# Patient Record
Sex: Male | Born: 1960 | Race: White | Hispanic: No | Marital: Married | Smoking: Current some day smoker
Health system: Southern US, Community
[De-identification: ages and names within clinical notes are randomized; demographics above are authoritative.]

## PROBLEM LIST (undated history)

## (undated) DIAGNOSIS — E1165 Type 2 diabetes mellitus with hyperglycemia: Secondary | ICD-10-CM

## (undated) DIAGNOSIS — F411 Generalized anxiety disorder: Secondary | ICD-10-CM

## (undated) DIAGNOSIS — F5101 Primary insomnia: Secondary | ICD-10-CM

## (undated) HISTORY — PX: APPENDECTOMY: SHX54

## (undated) HISTORY — DX: Generalized anxiety disorder: F41.1

## (undated) HISTORY — PX: SHOULDER SURGERY: SHX246

## (undated) HISTORY — PX: BACK SURGERY: SHX140

## (undated) HISTORY — DX: Primary insomnia: F51.01

## (undated) HISTORY — DX: Type 2 diabetes mellitus with hyperglycemia: E11.65

---

## 1999-07-28 ENCOUNTER — Inpatient Hospital Stay (HOSPITAL_COMMUNITY): Admission: AD | Admit: 1999-07-28 | Discharge: 1999-07-29 | Payer: Self-pay | Admitting: General Practice

## 2001-05-06 ENCOUNTER — Encounter: Payer: Self-pay | Admitting: Neurosurgery

## 2001-05-10 ENCOUNTER — Encounter: Payer: Self-pay | Admitting: Neurosurgery

## 2001-05-10 ENCOUNTER — Inpatient Hospital Stay (HOSPITAL_COMMUNITY): Admission: RE | Admit: 2001-05-10 | Discharge: 2001-05-13 | Payer: Self-pay | Admitting: Neurosurgery

## 2001-05-26 ENCOUNTER — Encounter: Admission: RE | Admit: 2001-05-26 | Discharge: 2001-05-26 | Payer: Self-pay | Admitting: Neurosurgery

## 2001-05-26 ENCOUNTER — Encounter: Payer: Self-pay | Admitting: Neurosurgery

## 2001-11-08 ENCOUNTER — Encounter (INDEPENDENT_AMBULATORY_CARE_PROVIDER_SITE_OTHER): Payer: Self-pay | Admitting: Specialist

## 2001-11-08 ENCOUNTER — Ambulatory Visit (HOSPITAL_COMMUNITY): Admission: RE | Admit: 2001-11-08 | Discharge: 2001-11-08 | Payer: Self-pay | Admitting: Gastroenterology

## 2002-11-08 ENCOUNTER — Encounter: Payer: Self-pay | Admitting: Neurosurgery

## 2002-11-08 ENCOUNTER — Encounter: Admission: RE | Admit: 2002-11-08 | Discharge: 2002-11-08 | Payer: Self-pay | Admitting: Neurosurgery

## 2002-12-20 ENCOUNTER — Encounter: Payer: Self-pay | Admitting: Neurosurgery

## 2002-12-20 ENCOUNTER — Encounter: Admission: RE | Admit: 2002-12-20 | Discharge: 2002-12-20 | Payer: Self-pay | Admitting: Neurosurgery

## 2003-01-11 ENCOUNTER — Encounter: Payer: Self-pay | Admitting: Neurosurgery

## 2003-01-11 ENCOUNTER — Encounter: Admission: RE | Admit: 2003-01-11 | Discharge: 2003-01-11 | Payer: Self-pay | Admitting: Neurosurgery

## 2004-03-10 ENCOUNTER — Encounter: Admission: RE | Admit: 2004-03-10 | Discharge: 2004-03-10 | Payer: Self-pay | Admitting: Neurosurgery

## 2008-04-23 ENCOUNTER — Encounter: Admission: RE | Admit: 2008-04-23 | Discharge: 2008-04-23 | Payer: Self-pay | Admitting: Orthopedic Surgery

## 2008-05-15 ENCOUNTER — Encounter (INDEPENDENT_AMBULATORY_CARE_PROVIDER_SITE_OTHER): Payer: Self-pay | Admitting: Orthopedic Surgery

## 2008-05-15 ENCOUNTER — Ambulatory Visit (HOSPITAL_COMMUNITY): Admission: RE | Admit: 2008-05-15 | Discharge: 2008-05-16 | Payer: Self-pay | Admitting: Orthopedic Surgery

## 2008-09-20 IMAGING — CR DG CHEST 2V
2 series · 2 of 2 positions shown · non-contrast
Comparison: None

CLINICAL DATA: Preop, rotator cuff repair

CHEST - 2 VIEW

[w chest pa]
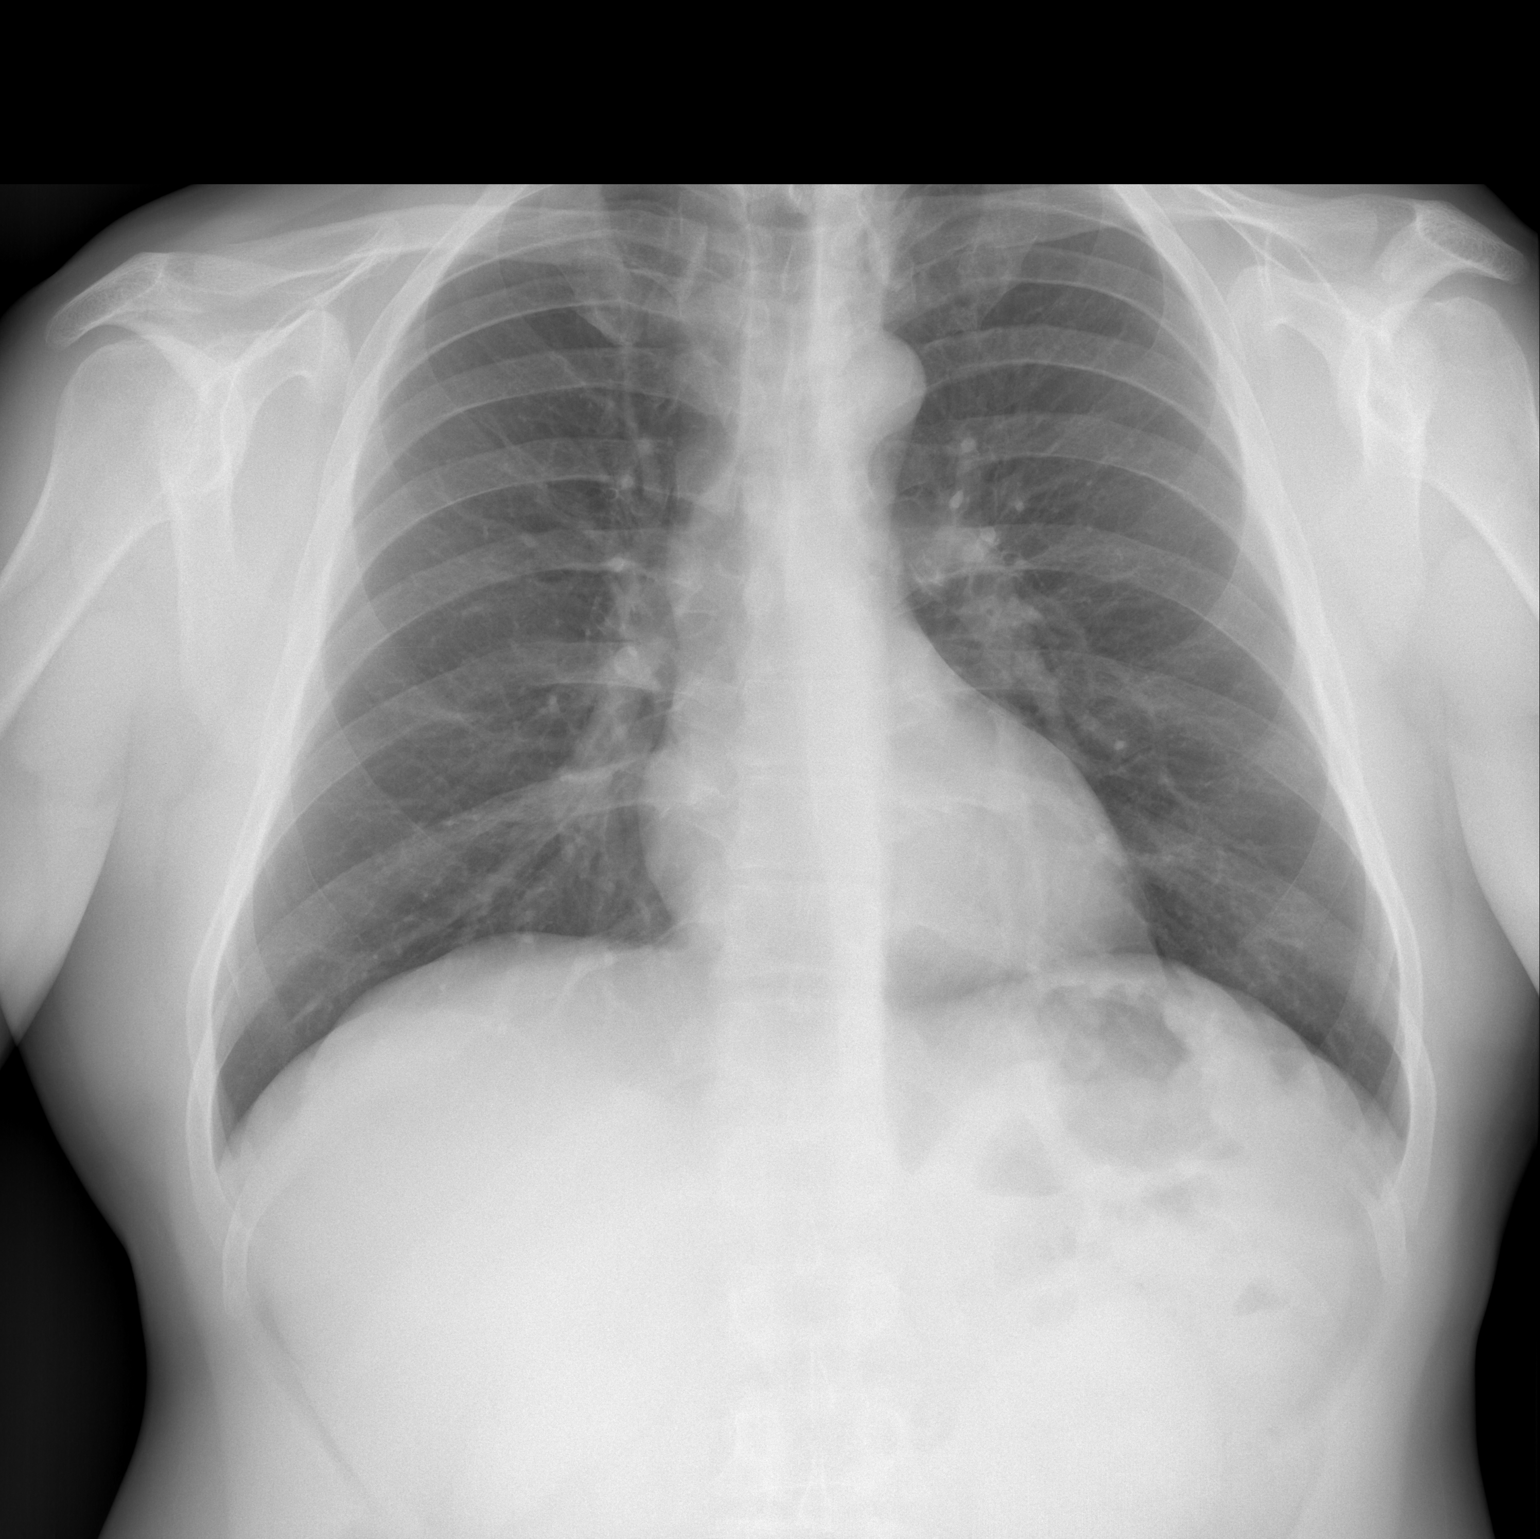

[w chest lat]
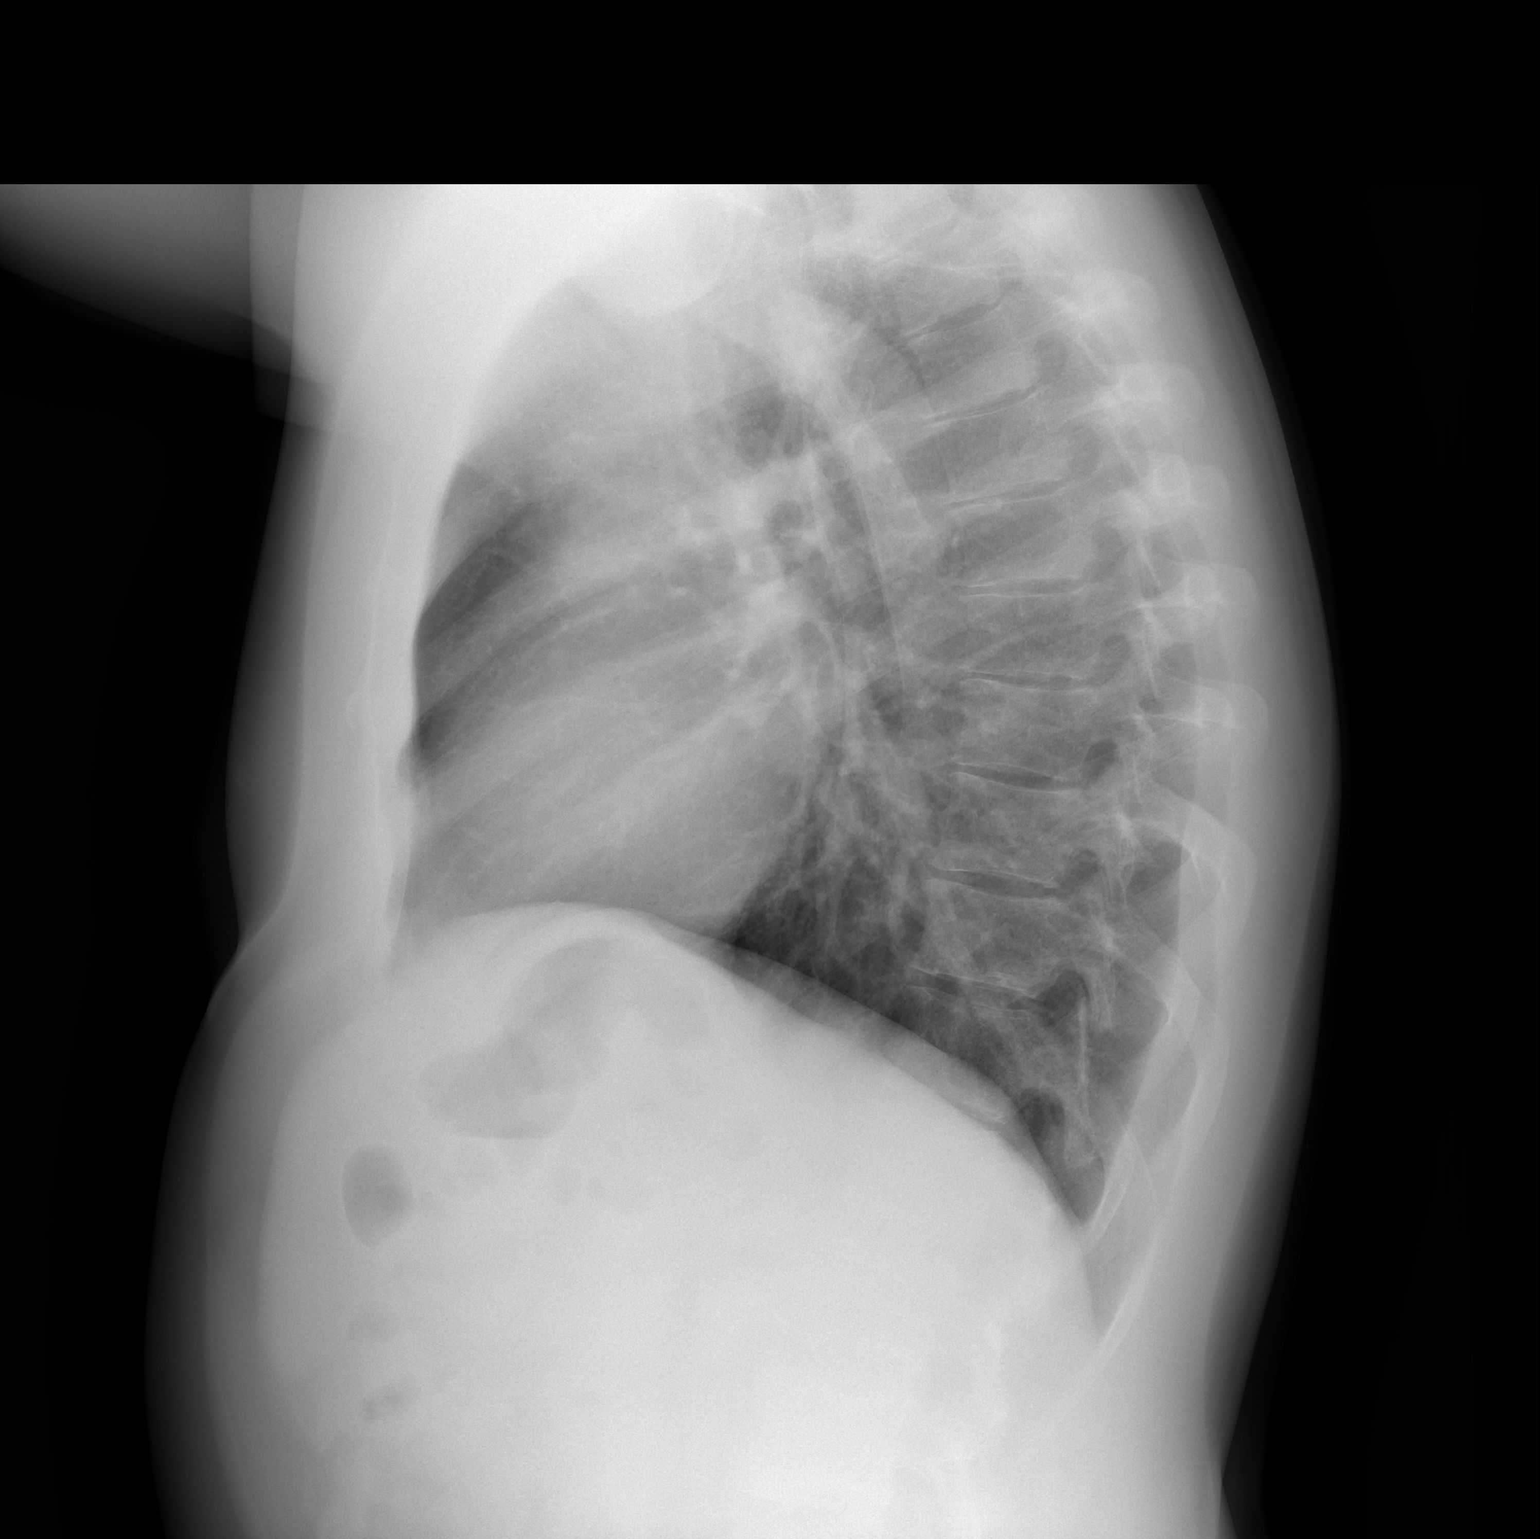

[2 of 2 positions shown; findings below may reference images not displayed]

FINDINGS: The heart is normal in size.  Mild tortuosity of the
aortic arch is present.  Bronchitic changes have a chronic
appearance.  Lungs are clear.  No pneumothoraces or effusions are
seen.  The thoracic spine is intact.
IMPRESSION: No active cardiopulmonary disease.

## 2010-10-12 ENCOUNTER — Encounter: Payer: Self-pay | Admitting: Neurosurgery

## 2011-02-03 NOTE — Op Note (Signed)
NAME:  Harry Bolton, Harry Bolton          ACCOUNT NO.:  [id number]   MEDICAL RECORD NO.:  [id number]          PATIENT TYPE:  AMB   LOCATION:  DAY                          FACILITY:  [facility]   PHYSICIAN:  Ronald A. Gioffre, M.D.DATE OF BIRTH:  [birth year]   DATE OF PROCEDURE:  05/15/2008  DATE OF DISCHARGE:                               OPERATIVE REPORT   SURGEON:  Georges Lynch. Darrelyn Hillock, M.D.   ASSISTANT:  Jamelle Rushing, P.A.   PREOPERATIVE DIAGNOSES:  1. Large ganglion cyst of the acromioclavicular joint, right shoulder.  2. Severe impingement syndrome, right shoulder.  3. Complete retracted tear of the rotator cuff tendon, right shoulder.   POSTOPERATIVE DIAGNOSES:  1. Large ganglion cyst the of the acromioclavicular joint, right      shoulder.  2. Severe impingement syndrome, right shoulder.  3. Complete retracted tear of the rotator cuff tendon, right shoulder.  4. Os acromiale.   OPERATION:  1. Excision of the os acromiale.  2. Partial acromioplasty.  3. Excision of a large ganglion cyst that appeared to be coming from      the os acromiale site.  4. Repair of a large retracted tear of the right rotator cuff tendon      utilizing a Restore tendon graft  and one PEEK anchor.   PROCEDURE:  First of all, the patient had 1 g of IV vancomycin preop.  At this time, a routine prep and drape of the right shoulder was carried  out.  An incision was made over the anterior aspect of the right  shoulder, I went down and identified the ganglion cyst and removed it.  It literally was adhered to the os acromiale junction with the acromion.  At that particular time I went down and split the deltoid tendon and  proximal portion of the deltoid muscle.  I split the deltoid tendon and  gently peeled that off of the os acromiale.  The os acromiale was  obviously moving about with pressure.  It was literally imbedded down  into the large hole that had created into the rotator cuff with  retraction.  At  that time we protected the rotator cuff and then gently  removed the os acromiale fragment that measured about 1.5 x 1.5 cm.  Following that we went down and protected the cuff again and then  utilized the bur and did an acromioplasty.  After we had reestablished  the subacromial space, we then utilized a bur to bur down the lateral  aspect of the distal humerus.  We removed some of the articular  cartilage until we had good bleeding bone.  I then inserted the PEEK  anchor into the proximal humerus with 4 sutures that literally came out  of the anchor.  We irrigated the area out.  We first did a primary side-  to-side repair of the proximal part of the rotator cuff.  We then  brought the suture anchors through the cuff and sutured it down in  place.  The other 2 anchors were utilized when we applied all our tissue  mend graft over the site with a 2 x  2-cm graft over the repair site.  We  anchored that down in place with #1 Tycron sutures.  Thoroughly  irrigated out the area.  He had a nice repair.  We then reapproximated  the deltoid tendon and muscle in the usual fashion.  Subcu was closed  with 0 Vicryl, skin with metal staples.  Note prior to being taken back  to surgery while he was in the holding area he had an interscalene nerve  block on the right.  Sterile Neosporin dressings were applied and he was  placed in a shoulder immobilizer.   Note, we did have a potassium 2.5 yesterday so we gave him potassium  supplement and at the time of surgery in the preop area his potassium  was rechecked, it was 2.9, upper limits of normal 3.5.  We then gave him  some IV potassium as well.           ______________________________  Georges Lynch. Darrelyn Hillock, M.D.     RAG/MEDQ  D:  05/15/2008  T:  05/16/2008  Job:  [id number]

## 2011-02-06 NOTE — Procedures (Signed)
[facility]  Patient:    NIV, DARLEY Visit Number: [id number] MRN: [id number]          Service Type: END Location: ENDO Attending Physician:  Nelda Marseille Dictated by:   Petra Kuba, M.D. Proc. Date: 11/08/01 Admit Date:  11/08/2001   CC:         Harry Bolton, M.D.  Gordan Payment, M.D., , [place]   Procedure Report  PROCEDURE:  Colonoscopy with biopsy.  INDICATION:  Diarrhea with some GI bleeding.  Consent was signed after risks, benefits, methods, and options thoroughly discussed in the office.  MEDICATIONS:  Demerol 150, Versed 13.  DESCRIPTION OF PROCEDURE:  Rectal inspection was pertinent for external hemorrhoids, small.  Digital exam was negative.  The video colonoscope was inserted and easily advanced around the colon to the cecum.  This did not require any abdominal pressure or any position changes.  The cecum was identified by the appendiceal orifice and the ileocecal valve.  No obvious abnormality was seen on insertion.  The scope was inserted a short ways into the terminal ileum which was normal; photodocumentation was obtained. Scattered biopsies of the TI were obtained and put in the first container. The scope was slowly withdrawn.  The prep was adequate.  There was some liquid stool that required washing and suctioning.  On slow withdrawal back to the rectum, the colon was normal.  Scattered random biopsies of the colon were obtained of all areas and put in a second container.  In the distal sigmoid, a questionable tiny polyp was seen and was cold biopsied x 2 and put in a third container.  Once back in the rectum, the scope was retroflexed, pertinent for some small internal hemorrhoids.  The scope was straightened and readvanced a short ways around the left side of the colon.   Air was suctioned and the scope removed.  The patient tolerated the procedure well.  There was no obvious immediate  complication.  ENDOSCOPIC DIAGNOSES: 1. Internal/external hemorrhoids. 2. Questionable tiny distal sigmoid polyp, status post cold biopsied. 3. Otherwise within normal limits to the terminal ileum, status post random    biopsies throughout.  PLAN:  Await pathology.  Trial of Robinul.  Probably upper GI small-bowel follow-through next but will wait on pathology and see how Robinul works. Dictated by:   Petra Kuba, M.D. Attending Physician:  Nelda Marseille DD:  11/08/01 TD:  11/08/01 Job: [id number]

## 2011-02-06 NOTE — Op Note (Signed)
[place]. [facility]  Patient:    Harry Bolton, Harry Bolton Visit Number: [id number] MRN: [id number]          Service Type: SUR Location: 3000 3039 [id number] Attending Physician:  Emeterio Reeve Proc. Date: 05/10/01 Adm. Date:  05/10/2001                             Operative Report  PREOPERATIVE DIAGNOSIS:   Severe spondylosis L5-S1.  POSTOPERATIVE DIAGNOSIS:  Severe spondylosis L5-S1.  OPERATION:  L5-S1 laminectomy, diskectomy and posterior lumbar interbody fusion with Ray-Tec infusion cage.  SURGEON:  Payton Doughty, M.D.  ASSISTANT:  Tanya Nones. Jeral Fruit, M.D.  ANESTHESIA:  General anesthesia.  PREPARATION:  Prepped with sterile Betadine and scrubbed with an alcohol wipe.  COMPLICATIONS:  None.  INDICATIONS:  [age]-year-old white gentleman with severe spondylosis of the lumbar spine and positive discography at L5-S1.   DESCRIPTION OF PROCEDURE: He was taken to the operating room, smoothly anesthetized and intubated and placed prone on the operating table.  Following shave, prep and drape in the usual sterile fashion, the skin was infiltrated with 1% lidocaine 1:400,000 epinephrine, and the skin was incised from the mid S1 to the mid L4 and the lamina of L5 and the L5-S1 facet joints were exposed bilaterally.  Intraoperative x-ray confirmed correctness level.  The pars interarticularis lamina, inferior facet of L5 and the superior facet of S1 were removed bilaterally and the bone set aside for grafting.  The ligament of flavum was removed, and the L5 and S1 nerve roots were bilaterally dissected free as they round their respective pedicles.  On the right side, there was modest facet arthropathy with some lateral recess narrowing on the left side. There was fairly profound facet arthropathy with almost no articular cartilage remaining in the facet joint and significant lateral recess narrowing. Following complete decompression of the nerve roots  bilaterally, the disk was removed without difficulty.  The 14 x 20 mm Ray-Tec infusion cages were then placed.  Intraoperative x-ray showed good placement of the cages.  They were packed with bone graft and harvested from the facet joints and capped.  The wound was irrigated and hemostasis assured.  The fascia was reapproximated with 0 Vicryl in an interrupted fashion.  The subcutaneous tissue was reapproximated with 0 Vicryl in an interrupted fashion.  The subcuticular tissue was reapproximated with 3-0 Vicryl in an interrupted fashion.  The skin was closed with 3-0 nylon in a running locked fashion.  Betadine and Telfa dressing was applied and made occlusive with Op-Site.  The patient returned to the recovery room in good condition. Attending Physician:  Emeterio Reeve DD:  05/10/01 TD:  05/10/01 Job: [id number]

## 2011-02-06 NOTE — Procedures (Signed)
[facility]  Patient:    Harry Bolton, Harry Bolton Visit Number: [id number] MRN: [id number]          Service Type: END Location: ENDO Attending Physician:  Nelda Marseille Dictated by:   Petra Kuba, M.D. Proc. Date: 11/08/01 Admit Date:  11/08/2001                             Procedure Report  NO DICTATION. Dictated by:   Petra Kuba, M.D. Attending Physician:  Nelda Marseille DD:  11/08/01 TD:  11/08/01 Job: [id number]
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
## 2013-06-05 DIAGNOSIS — G8929 Other chronic pain: Secondary | ICD-10-CM | POA: Insufficient documentation

## 2013-06-05 HISTORY — DX: Other chronic pain: G89.29

## 2015-04-01 DIAGNOSIS — I10 Essential (primary) hypertension: Secondary | ICD-10-CM | POA: Insufficient documentation

## 2015-04-01 DIAGNOSIS — I340 Nonrheumatic mitral (valve) insufficiency: Secondary | ICD-10-CM | POA: Insufficient documentation

## 2015-04-01 DIAGNOSIS — I341 Nonrheumatic mitral (valve) prolapse: Secondary | ICD-10-CM

## 2015-04-01 HISTORY — DX: Nonrheumatic mitral (valve) prolapse: I34.1

## 2015-04-01 HISTORY — DX: Essential (primary) hypertension: I10

## 2015-04-01 HISTORY — DX: Nonrheumatic mitral (valve) insufficiency: I34.0

## 2015-09-09 DIAGNOSIS — F17210 Nicotine dependence, cigarettes, uncomplicated: Secondary | ICD-10-CM

## 2015-09-09 HISTORY — DX: Nicotine dependence, cigarettes, uncomplicated: F17.210

## 2015-09-12 ENCOUNTER — Other Ambulatory Visit: Payer: Self-pay

## 2016-03-25 DIAGNOSIS — M5136 Other intervertebral disc degeneration, lumbar region: Secondary | ICD-10-CM | POA: Insufficient documentation

## 2016-03-25 DIAGNOSIS — Z8619 Personal history of other infectious and parasitic diseases: Secondary | ICD-10-CM

## 2016-03-25 DIAGNOSIS — K219 Gastro-esophageal reflux disease without esophagitis: Secondary | ICD-10-CM

## 2016-03-25 DIAGNOSIS — K589 Irritable bowel syndrome without diarrhea: Secondary | ICD-10-CM

## 2016-03-25 DIAGNOSIS — F909 Attention-deficit hyperactivity disorder, unspecified type: Secondary | ICD-10-CM

## 2016-03-25 DIAGNOSIS — M51369 Other intervertebral disc degeneration, lumbar region without mention of lumbar back pain or lower extremity pain: Secondary | ICD-10-CM

## 2016-03-25 DIAGNOSIS — Z79899 Other long term (current) drug therapy: Secondary | ICD-10-CM | POA: Insufficient documentation

## 2016-03-25 DIAGNOSIS — N529 Male erectile dysfunction, unspecified: Secondary | ICD-10-CM | POA: Insufficient documentation

## 2016-03-25 DIAGNOSIS — E782 Mixed hyperlipidemia: Secondary | ICD-10-CM

## 2016-03-25 DIAGNOSIS — F419 Anxiety disorder, unspecified: Secondary | ICD-10-CM

## 2016-03-25 HISTORY — DX: Mixed hyperlipidemia: E78.2

## 2016-03-25 HISTORY — DX: Gastro-esophageal reflux disease without esophagitis: K21.9

## 2016-03-25 HISTORY — DX: Personal history of other infectious and parasitic diseases: Z86.19

## 2016-03-25 HISTORY — DX: Attention-deficit hyperactivity disorder, unspecified type: F90.9

## 2016-03-25 HISTORY — DX: Other intervertebral disc degeneration, lumbar region without mention of lumbar back pain or lower extremity pain: M51.369

## 2016-03-25 HISTORY — DX: Other long term (current) drug therapy: Z79.899

## 2016-03-25 HISTORY — DX: Anxiety disorder, unspecified: F41.9

## 2016-03-25 HISTORY — DX: Irritable bowel syndrome, unspecified: K58.9

## 2016-03-25 HISTORY — DX: Male erectile dysfunction, unspecified: N52.9

## 2016-03-25 HISTORY — DX: Other intervertebral disc degeneration, lumbar region: M51.36

## 2017-01-12 DIAGNOSIS — N433 Hydrocele, unspecified: Secondary | ICD-10-CM

## 2017-01-12 HISTORY — DX: Hydrocele, unspecified: N43.3

## 2017-08-02 DIAGNOSIS — R7303 Prediabetes: Secondary | ICD-10-CM | POA: Insufficient documentation

## 2017-08-02 HISTORY — DX: Prediabetes: R73.03

## 2017-12-31 ENCOUNTER — Encounter: Payer: Self-pay | Admitting: Gastroenterology

## 2018-02-21 DIAGNOSIS — R011 Cardiac murmur, unspecified: Secondary | ICD-10-CM

## 2018-02-21 HISTORY — DX: Cardiac murmur, unspecified: R01.1

## 2020-04-04 DIAGNOSIS — R5381 Other malaise: Secondary | ICD-10-CM

## 2020-04-04 HISTORY — DX: Other malaise: R53.81

## 2021-09-30 DIAGNOSIS — R600 Localized edema: Secondary | ICD-10-CM | POA: Insufficient documentation

## 2021-09-30 HISTORY — DX: Localized edema: R60.0

## 2022-01-13 ENCOUNTER — Encounter: Payer: Self-pay | Admitting: *Deleted

## 2022-01-13 ENCOUNTER — Encounter: Payer: Self-pay | Admitting: Cardiology

## 2022-02-11 ENCOUNTER — Ambulatory Visit: Payer: Medicare Other | Admitting: Cardiology

## 2022-02-25 ENCOUNTER — Encounter: Payer: Self-pay | Admitting: Cardiology

## 2022-02-25 ENCOUNTER — Ambulatory Visit: Payer: Medicare Other | Admitting: Cardiology

## 2022-02-25 VITALS — BP 156/106 | HR 78 | Ht 68.0 in | Wt 224.2 lb

## 2022-02-25 DIAGNOSIS — R011 Cardiac murmur, unspecified: Secondary | ICD-10-CM

## 2022-02-25 DIAGNOSIS — E782 Mixed hyperlipidemia: Secondary | ICD-10-CM | POA: Diagnosis not present

## 2022-02-25 DIAGNOSIS — I10 Essential (primary) hypertension: Secondary | ICD-10-CM

## 2022-02-25 DIAGNOSIS — F17210 Nicotine dependence, cigarettes, uncomplicated: Secondary | ICD-10-CM | POA: Diagnosis not present

## 2022-02-25 DIAGNOSIS — N289 Disorder of kidney and ureter, unspecified: Secondary | ICD-10-CM

## 2022-02-25 DIAGNOSIS — E669 Obesity, unspecified: Secondary | ICD-10-CM

## 2022-02-25 DIAGNOSIS — E66811 Obesity, class 1: Secondary | ICD-10-CM

## 2022-02-25 HISTORY — DX: Obesity, class 1: E66.811

## 2022-02-25 HISTORY — DX: Obesity, unspecified: E66.9

## 2022-02-25 HISTORY — DX: Disorder of kidney and ureter, unspecified: N28.9

## 2022-02-25 HISTORY — DX: Cardiac murmur, unspecified: R01.1

## 2022-02-25 NOTE — Patient Instructions (Signed)
Please keep a BP log for 2 weeks and send by MyChart or mail.  Blood Pressure Record Sheet To take your blood pressure, you will need a blood pressure machine. You can buy a blood pressure machine (blood pressure monitor) at your clinic, drug store, or online. When choosing one, consider: An automatic monitor that has an arm cuff. A cuff that wraps snugly around your upper arm. You should be able to fit only one finger between your arm and the cuff. A device that stores blood pressure reading results. Do not choose a monitor that measures your blood pressure from your wrist or finger. Follow your health care provider's instructions for how to take your blood pressure. To use this form: Get one reading in the morning (a.m.) 1-2 hours after you take any medicines. Get one reading in the evening (p.m.) before supper. Write down the results in the spaces on this form. Repeat this once a week, or as told by your health care provider.  Make a follow-up appointment with your health care provider to discuss the results. Blood pressure log Date: _______________________ a.m. _____________________(1st reading) HR___________            p.m. _____________________(2nd reading) HR__________  Date: _______________________ a.m. _____________________(1st reading) HR___________            p.m. _____________________(2nd reading) HR__________ Date: _______________________ a.m. _____________________(1st reading) HR___________            p.m. _____________________(2nd reading) HR__________ Date: _______________________ a.m. _____________________(1st reading) HR___________            p.m. _____________________(2nd reading) HR__________  Date: _______________________ a.m. _____________________(1st reading) HR___________            p.m. _____________________(2nd reading) HR__________  Date: _______________________ a.m. _____________________(1st reading) HR___________            p.m.  _____________________(2nd reading) HR__________  Date: _______________________ a.m. _____________________(1st reading) HR___________            p.m. _____________________(2nd reading) HR__________   This information is not intended to replace advice given to you by your health care provider. Make sure you discuss any questions you have with your health care provider. Document Revised: 12/27/2019 Document Reviewed: 12/27/2019 Elsevier Patient Education  2021 Elsevier Inc.   Medication Instructions:  Your physician recommends that you continue on your current medications as directed. Please refer to the Current Medication list given to you today.  *If you need a refill on your cardiac medications before your next appointment, please call your pharmacy*   Lab Work: None ordered If you have labs (blood work) drawn today and your tests are completely normal, you will receive your results only by: MyChart Message (if you have MyChart) OR A paper copy in the mail If you have any lab test that is abnormal or we need to change your treatment, we will call you to review the results.   Testing/Procedures: Your physician has requested that you have an echocardiogram. Echocardiography is a painless test that uses sound waves to create images of your heart. It provides your doctor with information about the size and shape of your heart and how well your heart's chambers and valves are working. This procedure takes approximately one hour. There are no restrictions for this procedure.    Follow-Up: At [facility], you and your health needs are our priority.  As part of our continuing mission to provide you with exceptional heart care, we have created designated Provider Care Teams.  These Care Teams include your primary  Cardiologist (physician) and Advanced Practice Providers (APPs -  Physician Assistants and Nurse Practitioners) who all work together to provide you with the care you need, when  you need it.  We recommend signing up for the patient portal called "MyChart".  Sign up information is provided on this After Visit Summary.  MyChart is used to connect with patients for Virtual Visits (Telemedicine).  Patients are able to view lab/test results, encounter notes, upcoming appointments, etc.  Non-urgent messages can be sent to your provider as well.   To learn more about what you can do with MyChart, go to [email or web address].    Your next appointment:   3 month(s)  The format for your next appointment:   In Person  Provider:   Belva Crome, MD   Other Instructions Echocardiogram An echocardiogram is a test that uses sound waves (ultrasound) to produce images of the heart. Images from an echocardiogram can provide important information about: Heart size and shape. The size and thickness and movement of your heart's walls. Heart muscle function and strength. Heart valve function or if you have stenosis. Stenosis is when the heart valves are too narrow. If blood is flowing backward through the heart valves (regurgitation). A tumor or infectious growth around the heart valves. Areas of heart muscle that are not working well because of poor blood flow or injury from a heart attack. Aneurysm detection. An aneurysm is a weak or damaged part of an artery wall. The wall bulges out from the normal force of blood pumping through the body. Tell a health care provider about: Any allergies you have. All medicines you are taking, including vitamins, herbs, eye drops, creams, and over-the-counter medicines. Any blood disorders you have. Any surgeries you have had. Any medical conditions you have. Whether you are pregnant or may be pregnant. What are the risks? Generally, this is a safe test. However, problems may occur, including an allergic reaction to dye (contrast) that may be used during the test. What happens before the test? No specific preparation is needed. You may  eat and drink normally. What happens during the test? You will take off your clothes from the waist up and put on a hospital gown. Electrodes or electrocardiogram (ECG)patches may be placed on your chest. The electrodes or patches are then connected to a device that monitors your heart rate and rhythm. You will lie down on a table for an ultrasound exam. A gel will be applied to your chest to help sound waves pass through your skin. A handheld device, called a transducer, will be pressed against your chest and moved over your heart. The transducer produces sound waves that travel to your heart and bounce back (or "echo" back) to the transducer. These sound waves will be captured in real-time and changed into images of your heart that can be viewed on a video monitor. The images will be recorded on a computer and reviewed by your health care provider. You may be asked to change positions or hold your breath for a short time. This makes it easier to get different views or better views of your heart. In some cases, you may receive contrast through an IV in one of your veins. This can improve the quality of the pictures from your heart. The procedure may vary among health care providers and hospitals.   What can I expect after the test? You may return to your normal, everyday life, including diet, activities, and medicines, unless your health care provider tells you  not to do that. Follow these instructions at home: It is up to you to get the results of your test. Ask your health care provider, or the department that is doing the test, when your results will be ready. Keep all follow-up visits. This is important. Summary An echocardiogram is a test that uses sound waves (ultrasound) to produce images of the heart. Images from an echocardiogram can provide important information about the size and shape of your heart, heart muscle function, heart valve function, and other possible heart problems. You do  not need to do anything to prepare before this test. You may eat and drink normally. After the echocardiogram is completed, you may return to your normal, everyday life, unless your health care provider tells you not to do that. This information is not intended to replace advice given to you by your health care provider. Make sure you discuss any questions you have with your health care provider. Document Revised: 04/30/2020 Document Reviewed: 04/30/2020 Elsevier Patient Education  2021 [employer].

## 2022-02-25 NOTE — Progress Notes (Signed)
Cardiology Office Note:    Date:  02/25/2022   ID:  Harry Bowenshristopher R Hauth, DOB 04/11/1961, MRN [id number]  PCP:  Audie PintoGoins, Karen C, FNP  Cardiologist:  Garwin Brothersajan R Mykala Mccready, MD   Referring MD: Audie PintoGoins, Karen C, FNP    ASSESSMENT:    1. Essential hypertension   2. Mixed hyperlipidemia   3. Cardiac murmur   4. Obesity (BMI 30.0-34.9)   5. Renal insufficiency   6. Cigarette smoker    PLAN:    In order of problems listed above:  Primary prevention stressed with the patient.  Importance of compliance with diet medication stressed and vocalized understanding. Essential hypertension: Blood pressure is elevated and diet was emphasized.  Lifestyle modification urged.  He has not taken his blood pressure medications this morning he will take them regularly and keep a track of his blood pressures and get it back to us in the next few days. Cardiac murmur: Echocardiogram will be done to assess murmur heard on auscultation. Cigarette smoker: I spent 5 minutes with the patient discussing solely about smoking. Smoking cessation was counseled. I suggested to the patient also different medications and pharmacological interventions. Patient is keen to try stopping on its own at this time. He will get back to me if he needs any further assistance in this matter. Obesity: Weight reduction stressed diet was emphasized lifestyle modification urged and he promises to do better. Renal insufficiency: I discussed this with him at length and this is monitored by primary care. Patient will be seen in follow-up appointment in 3 months or earlier if the patient has any concerns    Medication Adjustments/Labs and Tests Ordered: Current medicines are reviewed at length with the patient today.  Concerns regarding medicines are outlined above.  Orders Placed This Encounter  Procedures   EKG 12-Lead   ECHOCARDIOGRAM COMPLETE   No orders of the defined types were placed in this encounter.    History of Present Illness:     Harry Bolton is a 61 y.o. male who is being seen today for the evaluation of essential hypertension and cardiac at the request of [name], [name], FNP.  Patient is a pleasant 61 year old male.  He mentions to me that he is on disability because of multiple back surgeries.  He has history of cardiac murmur essential hypertension and mixed dyslipidemia.  He is referred here for uncontrolled hypertension and cardiac murmur.  He denies any chest pain orthopnea or PND.  Past Medical History:  Diagnosis Date   ADHD (attention deficit hyperactivity disorder) 03/25/2016   Last Assessment & Plan:  Formatting of this note might be different from the original. No longer on meds for this he feels this is stable   Anxiety disorder 03/25/2016   Last Assessment & Plan:  Formatting of this note might be different from the original. He uses the xanax for this and is compliant with the use and not mixing the meds together   Bilateral lower extremity edema 09/30/2021   Chronic pain of left knee 06/05/2013   Cigarette smoker 09/09/2015   Degeneration of lumbar intervertebral disc 03/25/2016   Last Assessment & Plan:  Formatting of this note might be different from the original. Chronic for him with several back surgeries and on chronic narcotics which he has been compliant with and he states that he does try to stay active as much as he can and not make this worse   ED (erectile dysfunction) 03/25/2016   Essential hypertension 04/01/2015  Last Assessment & Plan:  Formatting of this note might be different from the original. His BP is better he states when he checks this at home will refill for him med as he needs see CMP advised to have eye updated as well   GERD without esophagitis 03/25/2016   Last Assessment & Plan:  Formatting of this note might be different from the original. This is stable for him and will follow   High risk medication use 03/25/2016   History of hepatitis C 03/25/2016   Formatting of this note  might be different from the original. Treated and no longer detected.   Last Assessment & Plan:  Formatting of this note might be different from the original. He has finished his treatment for this and he states he was told that he is now cured of this   Hydrocele in adult 01/12/2017   IBS (irritable bowel syndrome) 03/25/2016   Last Assessment & Plan:  Formatting of this note might be different from the original. He feels this is stable for him   Malaise and fatigue 04/04/2020   Mitral valve prolapse 04/01/2015   Mixed hyperlipidemia 03/25/2016   Last Assessment & Plan:  Formatting of this note might be different from the original. Update the lipids   Moderate mitral regurgitation 04/01/2015   Prediabetes 08/02/2017   Systolic murmur 02/21/2018    Past Surgical History:  Procedure Laterality Date   APPENDECTOMY     BACK SURGERY     2000, 2001, 2004, 2006   SHOULDER SURGERY      Current Medications: Current Meds  Medication Sig   ALPRAZolam (XANAX) 1 MG tablet Take 0.5-1 mg by mouth every 12 (twelve) hours as needed for anxiety.   ARIPiprazole (ABILIFY) 10 MG tablet Take 10 mg by mouth daily.   atorvastatin (LIPITOR) 10 MG tablet Take 1 tablet by mouth daily.   furosemide (LASIX) 20 MG tablet Take 1 tablet by mouth as needed for fluid or edema.   lisinopril-hydrochlorothiazide (ZESTORETIC) 10-12.5 MG tablet Take 1 tablet by mouth daily.   metoprolol tartrate (LOPRESSOR) 50 MG tablet Take 50 mg by mouth 2 (two) times daily.   sildenafil (REVATIO) 20 MG tablet Take 1-5 tablets by mouth as needed for erectile dysfunction.     Allergies:   Penicillins   Social History   Socioeconomic History   Marital status: Married    Spouse name: Not on file   Number of children: Not on file   Years of education: Not on file   Highest education level: Not on file  Occupational History   Not on file  Tobacco Use   Smoking status: Some Days    Types: Cigarettes   Smokeless tobacco: Never   Substance and Sexual Activity   Alcohol use: Not on file   Drug use: Not on file   Sexual activity: Not on file  Other Topics Concern   Not on file  Social History Narrative   Not on file   Social Determinants of Health   Financial Resource Strain: Not on file  Food Insecurity: Not on file  Transportation Needs: Not on file  Physical Activity: Not on file  Stress: Not on file  Social Connections: Not on file     Family History: The patient's family history includes Bipolar disorder in his father; Cancer in his maternal grandmother; Diabetes in his father.  ROS:   Please see the history of present illness.    All other systems reviewed and are  negative.  EKGs/Labs/Other Studies Reviewed:    The following studies were reviewed today: I discussed my findings with the patient at length.  EKG reveals sinus and nonspecific ST-T changes   Recent Labs: No results found for requested labs within last 8760 hours.  Recent Lipid Panel No results found for: CHOL, TRIG, HDL, CHOLHDL, VLDL, LDLCALC, LDLDIRECT  Physical Exam:    VS:  BP (!) 156/106   Pulse 78   Ht 5\' 8"  (1.727 m)   Wt 224 lb 3.2 oz (101.7 kg)   SpO2 94%   BMI 34.09 kg/m     Wt Readings from Last 3 Encounters:  02/25/22 224 lb 3.2 oz (101.7 kg)  01/08/22 229 lb (103.9 kg)     GEN: Patient is in no acute distress HEENT: Normal NECK: No JVD; No carotid bruits LYMPHATICS: No lymphadenopathy CARDIAC: S1 S2 regular, 2/6 systolic murmur at the apex. RESPIRATORY:  Clear to auscultation without rales, wheezing or rhonchi  ABDOMEN: Soft, non-tender, non-distended MUSCULOSKELETAL:  No edema; No deformity  SKIN: Warm and dry NEUROLOGIC:  Alert and oriented x 3 PSYCHIATRIC:  Normal affect    Signed, 01/10/22, MD  02/25/2022 5:08 PM    [facility]

## 2022-03-05 ENCOUNTER — Other Ambulatory Visit: Payer: Medicare Other

## 2022-03-12 ENCOUNTER — Ambulatory Visit (INDEPENDENT_AMBULATORY_CARE_PROVIDER_SITE_OTHER): Payer: Medicare Other

## 2022-03-12 DIAGNOSIS — R011 Cardiac murmur, unspecified: Secondary | ICD-10-CM

## 2022-03-12 LAB — ECHOCARDIOGRAM COMPLETE
Area-P 1/2: 3.83 cm2
MV M vel: 5.9 m/s
MV Peak grad: 139.2 mmHg
Radius: 0.73 cm
S' Lateral: 2.8 cm

## 2022-03-12 MED ORDER — PERFLUTREN LIPID MICROSPHERE
1.0000 mL | INTRAVENOUS | Status: AC | PRN
  Administered 2022-03-12: 4 mL via INTRAVENOUS

## 2022-03-18 ENCOUNTER — Ambulatory Visit: Payer: Medicare Other | Admitting: Cardiology

## 2022-03-18 ENCOUNTER — Encounter: Payer: Self-pay | Admitting: Cardiology

## 2022-03-18 VITALS — BP 148/88 | HR 70 | Ht 68.0 in | Wt 225.0 lb

## 2022-03-18 DIAGNOSIS — I340 Nonrheumatic mitral (valve) insufficiency: Secondary | ICD-10-CM

## 2022-03-18 DIAGNOSIS — I10 Essential (primary) hypertension: Secondary | ICD-10-CM

## 2022-03-18 DIAGNOSIS — R011 Cardiac murmur, unspecified: Secondary | ICD-10-CM

## 2022-03-18 DIAGNOSIS — E782 Mixed hyperlipidemia: Secondary | ICD-10-CM

## 2022-03-18 DIAGNOSIS — I341 Nonrheumatic mitral (valve) prolapse: Secondary | ICD-10-CM | POA: Diagnosis not present

## 2022-03-18 HISTORY — DX: Nonrheumatic mitral (valve) insufficiency: I34.0

## 2022-03-18 NOTE — Progress Notes (Signed)
Cardiology Office Note:    Date:  03/18/2022   ID:  Harry Bolton, DOB 27-Sep-1960, MRN [id number]  PCP:  Audie Pinto, FNP  Cardiologist:  Garwin Brothers, MD   Referring MD: Audie Pinto, FNP    ASSESSMENT:    1. Mitral valve prolapse   2. Mixed hyperlipidemia   3. Essential hypertension   4. Systolic murmur   5. Mitral valve insufficiency, unspecified etiology    PLAN:    In order of problems listed above:  Primary prevention stressed with the patient.  Importance of compliance with diet medication stressed any vocalized understanding. Essential hypertension: Blood pressure stable and diet was emphasized.  Lifestyle modification urged.  He has an element of whitecoat hypertension: Also is worried about the results of the echocardiogram.  He will keep a track of his blood pressure at home. Mitral valve prolapse with regurgitation: I discussed transesophageal echocardiography with him at extensive length.  Benefits and potential is explained and he vocalized understanding and questions were answered to his satisfaction.  He is agreeable.  He is going on a 2-week visit to [place] on a vacation and he told me that when he comes back he will get in touch with me and we will schedule him for this. Mixed dyslipidemia and obesity risks explained and he promises to do better.  He also has history of smoking and I counseled him against this. Patient will be seen in follow-up appointment in 6 months or earlier if the patient has any concerns    Medication Adjustments/Labs and Tests Ordered: Current medicines are reviewed at length with the patient today.  Concerns regarding medicines are outlined above.  No orders of the defined types were placed in this encounter.  No orders of the defined types were placed in this encounter.    No chief complaint on file.    History of Present Illness:    Harry Bolton is a 61 y.o. male.  Patient has past medical history of  essential hypertension, mixed dyslipidemia, mitral valve prolapse and regurgitation with myxomatous appearing mitral valve and cigarette smoking.  He leads a sedentary lifestyle because of disability.  His echocardiogram was abnormal with posterior leaflet prolapse of the mitral valve with mitral regurgitation.  Dr. Dulce Sellar messaged me saying that he was not comfortable with the degree of mitral regurgitation assessment as it was suboptimal and suggested that further evaluation with a TEE would be a good idea in this gentleman.  Patient was called here to discuss this.  Again he leads a sedentary lifestyle.  He denies any symptoms.  He tells me that he has been worried about this visit because of the abnormal beyond the echocardiogram.  Past Medical History:  Diagnosis Date   ADHD (attention deficit hyperactivity disorder) 03/25/2016   Last Assessment & Plan:  Formatting of this note might be different from the original. No longer on meds for this he feels this is stable   Anxiety disorder 03/25/2016   Last Assessment & Plan:  Formatting of this note might be different from the original. He uses the xanax for this and is compliant with the use and not mixing the meds together   Bilateral lower extremity edema 09/30/2021   Cardiac murmur 02/25/2022   Chronic pain of left knee 06/05/2013   Cigarette smoker 09/09/2015   Degeneration of lumbar intervertebral disc 03/25/2016   Last Assessment & Plan:  Formatting of this note might be different from the original. Chronic for  him with several back surgeries and on chronic narcotics which he has been compliant with and he states that he does try to stay active as much as he can and not make this worse   ED (erectile dysfunction) 03/25/2016   Essential hypertension 04/01/2015   Last Assessment & Plan:  Formatting of this note might be different from the original. His BP is better he states when he checks this at home will refill for him med as he needs see CMP advised to  have eye updated as well   GERD without esophagitis 03/25/2016   Last Assessment & Plan:  Formatting of this note might be different from the original. This is stable for him and will follow   High risk medication use 03/25/2016   History of hepatitis C 03/25/2016   Formatting of this note might be different from the original. Treated and no longer detected.   Last Assessment & Plan:  Formatting of this note might be different from the original. He has finished his treatment for this and he states he was told that he is now cured of this   Hydrocele in adult 01/12/2017   IBS (irritable bowel syndrome) 03/25/2016   Last Assessment & Plan:  Formatting of this note might be different from the original. He feels this is stable for him   Malaise and fatigue 04/04/2020   Mitral valve prolapse 04/01/2015   Mixed hyperlipidemia 03/25/2016   Last Assessment & Plan:  Formatting of this note might be different from the original. Update the lipids   Moderate mitral regurgitation 04/01/2015   Obesity (BMI 30.0-34.9) 02/25/2022   Prediabetes 08/02/2017   Renal insufficiency 02/25/2022   Systolic murmur 02/21/2018    Past Surgical History:  Procedure Laterality Date   APPENDECTOMY     BACK SURGERY     2000, 2001, 2004, 2006   SHOULDER SURGERY      Current Medications: Current Meds  Medication Sig   ALPRAZolam (XANAX) 1 MG tablet Take 0.5-1 mg by mouth every 12 (twelve) hours as needed for anxiety.   ARIPiprazole (ABILIFY) 10 MG tablet Take 10 mg by mouth daily.   atorvastatin (LIPITOR) 10 MG tablet Take 1 tablet by mouth daily.   furosemide (LASIX) 20 MG tablet Take 1 tablet by mouth as needed for fluid or edema.   lisinopril-hydrochlorothiazide (ZESTORETIC) 10-12.5 MG tablet Take 1 tablet by mouth daily.   metoprolol tartrate (LOPRESSOR) 50 MG tablet Take 50 mg by mouth 2 (two) times daily.   sildenafil (REVATIO) 20 MG tablet Take 1-5 tablets by mouth as needed for erectile dysfunction.     Allergies:    Penicillins   Social History   Socioeconomic History   Marital status: Married    Spouse name: Not on file   Number of children: Not on file   Years of education: Not on file   Highest education level: Not on file  Occupational History   Not on file  Tobacco Use   Smoking status: Some Days    Types: Cigarettes   Smokeless tobacco: Never  Substance and Sexual Activity   Alcohol use: Not on file   Drug use: Not on file   Sexual activity: Not on file  Other Topics Concern   Not on file  Social History Narrative   Not on file   Social Determinants of Health   Financial Resource Strain: Not on file  Food Insecurity: Not on file  Transportation Needs: Not on file  Physical Activity:  Not on file  Stress: Not on file  Social Connections: Not on file     Family History: The patient's family history includes Bipolar disorder in his father; Cancer in his maternal grandmother; Diabetes in his father.  ROS:   Please see the history of present illness.    All other systems reviewed and are negative.  EKGs/Labs/Other Studies Reviewed:    The following studies were reviewed today: Discussed my findings with the patient at length.   Recent Labs: No results found for requested labs within last 365 days.  Recent Lipid Panel No results found for: "CHOL", "TRIG", "HDL", "CHOLHDL", "VLDL", "LDLCALC", "LDLDIRECT"  Physical Exam:    VS:  BP (!) 148/88   Pulse 70   Ht 5\' 8"  (1.727 m)   Wt 225 lb (102.1 kg)   SpO2 95%   BMI 34.21 kg/m     Wt Readings from Last 3 Encounters:  03/18/22 225 lb (102.1 kg)  02/25/22 224 lb 3.2 oz (101.7 kg)  01/08/22 229 lb (103.9 kg)     GEN: Patient is in no acute distress HEENT: Normal NECK: No JVD; No carotid bruits LYMPHATICS: No lymphadenopathy CARDIAC: Hear sounds regular, 2/6 systolic murmur at the apex. RESPIRATORY:  Clear to auscultation without rales, wheezing or rhonchi  ABDOMEN: Soft, non-tender,  non-distended MUSCULOSKELETAL:  No edema; No deformity  SKIN: Warm and dry NEUROLOGIC:  Alert and oriented x 3 PSYCHIATRIC:  Normal affect   Signed, 01/10/22, MD  03/18/2022 3:41 PM    [facility]

## 2022-03-18 NOTE — Patient Instructions (Signed)
Medication Instructions:  Your physician recommends that you continue on your current medications as directed. Please refer to the Current Medication list given to you today.  *If you need a refill on your cardiac medications before your next appointment, please call your pharmacy*   Lab Work: None ordered If you have labs (blood work) drawn today and your tests are completely normal, you will receive your results only by: MyChart Message (if you have MyChart) OR A paper copy in the mail If you have any lab test that is abnormal or we need to change your treatment, we will call you to review the results.   Testing/Procedures: None ordered   Follow-Up: At [facility], you and your health needs are our priority.  As part of our continuing mission to provide you with exceptional heart care, we have created designated Provider Care Teams.  These Care Teams include your primary Cardiologist (physician) and Advanced Practice Providers (APPs -  Physician Assistants and Nurse Practitioners) who all work together to provide you with the care you need, when you need it.  We recommend signing up for the patient portal called "MyChart".  Sign up information is provided on this After Visit Summary.  MyChart is used to connect with patients for Virtual Visits (Telemedicine).  Patients are able to view lab/test results, encounter notes, upcoming appointments, etc.  Non-urgent messages can be sent to your provider as well.   To learn more about what you can do with MyChart, go to [email or web address].    Your next appointment:   3 month(s)  The format for your next appointment:   In Person  Provider:   Belva Crome, MD   Other Instructions  Transesophageal Echocardiogram Transesophageal echocardiogram (TEE) is a test that uses sound waves to take pictures of your heart. TEE is done by passing a small probe attached to a flexible tube down the part of the body that moves food from your  mouth to your stomach (esophagus). The pictures give clear images of your heart. This can help your doctor see if there are problems with your heart. Tell a doctor about: Any allergies you have. All medicines you are taking. This includes vitamins, herbs, eye drops, creams, and over-the-counter medicines. Any problems you or family members have had with anesthetic medicines. Any blood disorders you have. Any surgeries you have had. Any medical conditions you have. Any swallowing problems. Whether you have or have had a blockage in the part of the body that moves food from your mouth to your stomach. Whether you are pregnant or may be pregnant. What are the risks? In general, this is a safe procedure. But, problems may occur, such as: Damage to nearby structures or organs. A tear in the part of the body that moves food from your mouth to your stomach. Irregular heartbeat. Hoarse voice or trouble swallowing. Bleeding. What happens before the procedure? Medicines Ask your doctor about changing or stopping: Your normal medicines. Vitamins, herbs, and supplements. Over-the-counter medicines. Do not take aspirin or ibuprofen unless you are told to. General instructions Follow instructions from your doctor about what you cannot eat or drink. You will take out any dentures or dental retainers. Plan to have a responsible adult take you home from the hospital or clinic. Plan to have a responsible adult care for you for the time you are told after you leave the hospital or clinic. This is important. What happens during the procedure?  An IV will be put into one of  your veins. You may be given: A sedative. This medicine helps you relax. A medicine to numb the back of your throat. This may be sprayed or gargled. Your blood pressure, heart rate, and breathing will be watched. You may be asked to lie on your left side. A bite block will be placed in your mouth. This keeps you from biting the  tube. The tip of the probe will be placed into the back of your mouth. You will be asked to swallow. Your doctor will take pictures of your heart. The probe and bite block will be taken out after the test is done. The procedure may vary among doctors and hospitals. What can I expect after the procedure? You will be monitored until you leave the hospital or clinic. This includes checking your blood pressure, heart rate, breathing rate, and blood oxygen level. Your throat may feel sore and numb. This will get better over time. You will not be allowed to eat or drink until the numbness has gone away. It is common to have a sore throat for a day or two. It is up to you to get the results of your procedure. Ask how to get your results when they are ready. Follow these instructions at home: If you were given a sedative during your procedure, do not drive or use machines until your doctor says that it is safe. Return to your normal activities when your doctor says that it is safe. Keep all follow-up visits. Summary TEE is a test that uses sound waves to take pictures of your heart. You will be given a medicine to help you relax. Do not drive or use machines until your doctor says that it is safe. This information is not intended to replace advice given to you by your health care provider. Make sure you discuss any questions you have with your health care provider. Document Revised: 05/21/2021 Document Reviewed: 04/30/2020 Elsevier Patient Education  2023 [employer].

## 2022-03-30 DIAGNOSIS — M23307 Other meniscus derangements, unspecified meniscus, left knee: Secondary | ICD-10-CM

## 2022-03-30 DIAGNOSIS — R600 Localized edema: Secondary | ICD-10-CM

## 2022-03-30 HISTORY — DX: Localized edema: R60.0

## 2022-03-30 HISTORY — DX: Other meniscus derangements, unspecified meniscus, left knee: M23.307

## 2022-05-28 ENCOUNTER — Ambulatory Visit: Payer: Medicare Other | Admitting: Cardiology

## 2022-06-24 ENCOUNTER — Ambulatory Visit: Payer: Medicare Other | Attending: Cardiology | Admitting: Cardiology

## 2022-11-27 DIAGNOSIS — N1831 Chronic kidney disease, stage 3a: Secondary | ICD-10-CM

## 2022-11-27 HISTORY — DX: Chronic kidney disease, stage 3a: N18.31

## 2022-12-23 HISTORY — DX: Morbid (severe) obesity due to excess calories: E66.01

## 2023-06-24 ENCOUNTER — Other Ambulatory Visit: Payer: Self-pay

## 2023-06-24 DIAGNOSIS — E1165 Type 2 diabetes mellitus with hyperglycemia: Secondary | ICD-10-CM | POA: Insufficient documentation

## 2023-06-24 DIAGNOSIS — F5101 Primary insomnia: Secondary | ICD-10-CM | POA: Insufficient documentation

## 2023-06-24 DIAGNOSIS — F411 Generalized anxiety disorder: Secondary | ICD-10-CM | POA: Insufficient documentation

## 2023-06-28 ENCOUNTER — Ambulatory Visit: Payer: Medicare Other | Admitting: Cardiology

## 2023-07-28 ENCOUNTER — Ambulatory Visit: Payer: Medicare Other | Admitting: Cardiology

## 2023-07-29 ENCOUNTER — Other Ambulatory Visit: Payer: Self-pay | Admitting: Orthopedic Surgery

## 2023-07-29 DIAGNOSIS — M24419 Recurrent dislocation, unspecified shoulder: Secondary | ICD-10-CM

## 2023-08-25 ENCOUNTER — Ambulatory Visit: Payer: Medicare Other | Admitting: Orthopedic Surgery

## 2023-08-25 ENCOUNTER — Other Ambulatory Visit: Payer: Self-pay

## 2023-08-25 DIAGNOSIS — M75122 Complete rotator cuff tear or rupture of left shoulder, not specified as traumatic: Secondary | ICD-10-CM | POA: Diagnosis not present

## 2023-08-26 ENCOUNTER — Ambulatory Visit: Payer: Medicare Other | Attending: Cardiology | Admitting: Cardiology

## 2023-08-27 ENCOUNTER — Encounter: Payer: Self-pay | Admitting: Orthopedic Surgery

## 2023-08-27 NOTE — Progress Notes (Signed)
Office Visit Note   Patient: Harry Bolton           Date of Birth: 03-01-1961           MRN: [id number] Visit Date: 08/25/2023 Requested by: Audie Pinto, FNP [address] [place],  [place] [address] PCP: Audie Pinto, FNP  Subjective: Chief Complaint  Patient presents with   Left Shoulder - Pain    HPI: ZYMIERE ISHII is a 62 y.o. male who presents to the office reporting left shoulder pain.  Describes constant pain.  Patient had an injury 07/17/2023 when he was running up stairs and he tripped and he reached out to brace himself and his shoulder dislocated.  Patient also reports another dislocation in April of last year.  Describes decreased strength in that left hand and arm.  Denies any neck pain or radicular pain.  The pain in his shoulder does wake him from sleep at night.  Describes occasional numbness in the arm.  Patient is on disability for history of 3 back surgeries.  He is taking Tylenol for pain.  No history of prior left shoulder surgery.  MRI arthrogram of that left shoulder is reviewed.  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  There is also near full-thickness tearing of the subscapularis and complete tear of the intra-articular biceps.  Some significant degenerative changes also present on the humeral head..                ROS: All systems reviewed are negative as they relate to the chief complaint within the history of present illness.  Patient denies fevers or chills.  Assessment & Plan: Visit Diagnoses:  1. Complete tear of left rotator cuff, unspecified whether traumatic     Plan: Impression is left shoulder pain with retracted unrepairable rotator cuff tears.  Patient has 2 options.  1 option would be to live with what he has to see what kind of function he can regain.  I think he likely had significant rotator cuff  tearing with his first dislocation but then had more significant tearing with his second dislocation involving the subscap.  They could be with giving him most of his weakness at this time is the loss of that force couple.  Operative treatment for this patient for this problem would consist of reverse shoulder replacement.  Risk and benefits are discussed.  Rehab is discussed.  I think it is possible but unlikely that he would develop recurrent instability in the shoulder.  He is going to consider his options and he will come back and follow-up with [place] as needed.  Follow-Up Instructions: No follow-ups on file.   Orders:  No orders of the defined types were placed in this encounter.  No orders of the defined types were placed in this encounter.     Procedures: No procedures performed   Clinical Data: No additional findings.  Objective: Vital Signs: There were no vitals taken for this visit.  Physical Exam:  Constitutional: Patient appears well-developed HEENT:  Head: Normocephalic Eyes:EOM are normal Neck: Normal range of motion Cardiovascular: Normal rate Pulmonary/chest: Effort normal Neurologic: Patient is alert Skin: Skin is warm Psychiatric: Patient has normal mood and affect  Ortho Exam: Left shoulder exam demonstrates subscap weakness of 4+ out of 5 on the left compared to 5+ out of 5 on the right.  External rotation weakness is 3 out of 5 on the left 5+ out of 5 on the right.  Patient has only about 45 degrees of active forward flexion abduction.  Passively we can get the shoulder higher but it is with some pain.  I do not detect much in the way of instability in the shoulder with anterior and posterior mobilization of the humeral head within the glenoid.  Specialty Comments:  No specialty comments available.  Imaging: No results found.   PMFS History: Patient Active Problem List   Diagnosis Date Noted   Generalized anxiety disorder    Primary insomnia    Type 2  diabetes mellitus with hyperglycemia, without long-term current use of insulin (HCC)    Morbid obesity due to excess calories (HCC) 12/23/2022   Stage 3a chronic kidney disease (HCC) 11/27/2022   Degeneration of meniscus of knee, left 03/30/2022   Mild peripheral edema 03/30/2022   Mitral regurgitation 03/18/2022   Cardiac murmur 02/25/2022   Obesity (BMI 30.0-34.9) 02/25/2022   Renal insufficiency 02/25/2022   Bilateral lower extremity edema 09/30/2021   Malaise and fatigue 04/04/2020   Systolic murmur 02/21/2018   Prediabetes 08/02/2017   Hydrocele in adult 01/12/2017   ADHD (attention deficit hyperactivity disorder) 03/25/2016   Anxiety disorder 03/25/2016   Degeneration of lumbar intervertebral disc 03/25/2016   ED (erectile dysfunction) 03/25/2016   GERD without esophagitis 03/25/2016   High risk medication use 03/25/2016   History of hepatitis C 03/25/2016   IBS (irritable bowel syndrome) 03/25/2016   Mixed hyperlipidemia 03/25/2016   Cigarette smoker 09/09/2015   Essential hypertension 04/01/2015   Mitral valve prolapse 04/01/2015   Moderate mitral regurgitation 04/01/2015   Chronic pain of left knee 06/05/2013   Past Medical History:  Diagnosis Date   ADHD (attention deficit hyperactivity disorder) 03/25/2016   Last Assessment & Plan:  Formatting of this note might be different from the original. No longer on meds for this he feels this is stable   Anxiety disorder 03/25/2016   Last Assessment & Plan:  Formatting of this note might be different from the original. He uses the xanax for this and is compliant with the use and not mixing the meds together   Bilateral lower extremity edema 09/30/2021   Cardiac murmur 02/25/2022   Chronic pain of left knee 06/05/2013   Cigarette smoker 09/09/2015   Degeneration of lumbar intervertebral disc 03/25/2016   Last Assessment & Plan:  Formatting of this note might be different from the original. Chronic for him with several back  surgeries and on chronic narcotics which he has been compliant with and he states that he does try to stay active as much as he can and not make this worse   Degeneration of meniscus of knee, left 03/30/2022   ED (erectile dysfunction) 03/25/2016   Essential hypertension 04/01/2015   Last Assessment & Plan:  Formatting of this note might be different from the original. His BP is better he states when he checks this at home will refill for him med as he needs see CMP advised to have eye updated as well   Generalized anxiety disorder    GERD without esophagitis 03/25/2016   Last Assessment & Plan:  Formatting of this note might be different from the original. This is stable for him and will follow   High risk medication use 03/25/2016   History of hepatitis C 03/25/2016   Formatting of this note might be different from the original. Treated and no  longer detected.   Last Assessment & Plan:  Formatting of this note might be different from the original. He has finished his treatment for this and he states he was told that he is now cured of this   Hydrocele in adult 01/12/2017   IBS (irritable bowel syndrome) 03/25/2016   Last Assessment & Plan:  Formatting of this note might be different from the original. He feels this is stable for him   Malaise and fatigue 04/04/2020   Mild peripheral edema 03/30/2022   Mitral regurgitation 03/18/2022   Mitral valve prolapse 04/01/2015   Mixed hyperlipidemia 03/25/2016   Last Assessment & Plan:  Formatting of this note might be different from the original. Update the lipids   Moderate mitral regurgitation 04/01/2015   Morbid obesity due to excess calories (HCC) 12/23/2022   Obesity (BMI 30.0-34.9) 02/25/2022   Prediabetes 08/02/2017   Primary insomnia    Renal insufficiency 02/25/2022   Stage 3a chronic kidney disease (HCC) 11/27/2022   Systolic murmur 02/21/2018   Type 2 diabetes mellitus with hyperglycemia, without long-term current use of insulin (HCC)      Family History  Problem Relation Age of Onset   Bipolar disorder Father    Diabetes Father    Cancer Maternal Grandmother     Past Surgical History:  Procedure Laterality Date   APPENDECTOMY     BACK SURGERY     2000, 2001, 2004, 2006   SHOULDER SURGERY     Social History   Occupational History   Not on file  Tobacco Use   Smoking status: Some Days    Types: Cigarettes   Smokeless tobacco: Never  Substance and Sexual Activity   Alcohol use: Not on file   Drug use: Not on file   Sexual activity: Not on file

## 2023-11-04 DIAGNOSIS — Z01818 Encounter for other preprocedural examination: Secondary | ICD-10-CM | POA: Diagnosis not present

## 2024-07-24 ENCOUNTER — Encounter: Payer: Self-pay | Admitting: Radiology

## 2024-07-31 DIAGNOSIS — I340 Nonrheumatic mitral (valve) insufficiency: Secondary | ICD-10-CM | POA: Diagnosis not present
